# Patient Record
Sex: Male | Born: 2000 | Race: Black or African American | Hispanic: No | Marital: Single
Health system: Southern US, Community
[De-identification: ages and names within clinical notes are randomized; demographics above are authoritative.]

## PROBLEM LIST (undated history)

## (undated) DIAGNOSIS — F909 Attention-deficit hyperactivity disorder, unspecified type: Secondary | ICD-10-CM

---
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
## 2020-11-05 ENCOUNTER — Emergency Department: Payer: Self-pay

## 2020-11-05 ENCOUNTER — Inpatient Hospital Stay: Payer: Self-pay

## 2020-11-05 ENCOUNTER — Inpatient Hospital Stay
Admission: EM | Admit: 2020-11-05 | Discharge: 2020-11-12 | DRG: 917 | Disposition: E | Payer: Self-pay | Attending: Pulmonary Disease | Admitting: Pulmonary Disease

## 2020-11-05 ENCOUNTER — Encounter: Payer: Self-pay | Admitting: Emergency Medicine

## 2020-11-05 DIAGNOSIS — Z20822 Contact with and (suspected) exposure to covid-19: Secondary | ICD-10-CM | POA: Diagnosis present

## 2020-11-05 DIAGNOSIS — R001 Bradycardia, unspecified: Secondary | ICD-10-CM | POA: Diagnosis present

## 2020-11-05 DIAGNOSIS — E872 Acidosis, unspecified: Secondary | ICD-10-CM

## 2020-11-05 DIAGNOSIS — F1410 Cocaine abuse, uncomplicated: Secondary | ICD-10-CM

## 2020-11-05 DIAGNOSIS — T405X1A Poisoning by cocaine, accidental (unintentional), initial encounter: Principal | ICD-10-CM | POA: Diagnosis present

## 2020-11-05 DIAGNOSIS — K7200 Acute and subacute hepatic failure without coma: Secondary | ICD-10-CM

## 2020-11-05 DIAGNOSIS — J9602 Acute respiratory failure with hypercapnia: Secondary | ICD-10-CM | POA: Diagnosis present

## 2020-11-05 DIAGNOSIS — J9601 Acute respiratory failure with hypoxia: Secondary | ICD-10-CM

## 2020-11-05 DIAGNOSIS — Y92009 Unspecified place in unspecified non-institutional (private) residence as the place of occurrence of the external cause: Secondary | ICD-10-CM

## 2020-11-05 DIAGNOSIS — R570 Cardiogenic shock: Secondary | ICD-10-CM

## 2020-11-05 DIAGNOSIS — E874 Mixed disorder of acid-base balance: Secondary | ICD-10-CM | POA: Diagnosis present

## 2020-11-05 DIAGNOSIS — F14129 Cocaine abuse with intoxication, unspecified: Secondary | ICD-10-CM | POA: Diagnosis present

## 2020-11-05 DIAGNOSIS — Z66 Do not resuscitate: Secondary | ICD-10-CM | POA: Diagnosis not present

## 2020-11-05 DIAGNOSIS — T17908A Unspecified foreign body in respiratory tract, part unspecified causing other injury, initial encounter: Secondary | ICD-10-CM

## 2020-11-05 DIAGNOSIS — Z515 Encounter for palliative care: Secondary | ICD-10-CM

## 2020-11-05 DIAGNOSIS — I468 Cardiac arrest due to other underlying condition: Secondary | ICD-10-CM | POA: Diagnosis present

## 2020-11-05 DIAGNOSIS — E875 Hyperkalemia: Secondary | ICD-10-CM

## 2020-11-05 DIAGNOSIS — R739 Hyperglycemia, unspecified: Secondary | ICD-10-CM | POA: Diagnosis present

## 2020-11-05 DIAGNOSIS — G936 Cerebral edema: Secondary | ICD-10-CM | POA: Diagnosis present

## 2020-11-05 DIAGNOSIS — G931 Anoxic brain damage, not elsewhere classified: Secondary | ICD-10-CM

## 2020-11-05 DIAGNOSIS — I469 Cardiac arrest, cause unspecified: Secondary | ICD-10-CM | POA: Diagnosis present

## 2020-11-05 LAB — CBC WITH DIFFERENTIAL/PLATELET
Abs Immature Granulocytes: 1.74 10*3/uL — ABNORMAL HIGH (ref 0.00–0.07)
Basophils Absolute: 0.1 10*3/uL (ref 0.0–0.1)
Basophils Relative: 0 %
Eosinophils Absolute: 0.1 10*3/uL (ref 0.0–0.5)
Eosinophils Relative: 0 %
HCT: 49 % (ref 39.0–52.0)
Hemoglobin: 14.2 g/dL (ref 13.0–17.0)
Immature Granulocytes: 9 %
Lymphocytes Relative: 33 %
Lymphs Abs: 6.5 10*3/uL — ABNORMAL HIGH (ref 0.7–4.0)
MCH: 24.5 pg — ABNORMAL LOW (ref 26.0–34.0)
MCHC: 29 g/dL — ABNORMAL LOW (ref 30.0–36.0)
MCV: 84.5 fL (ref 80.0–100.0)
Monocytes Absolute: 1.7 10*3/uL — ABNORMAL HIGH (ref 0.1–1.0)
Monocytes Relative: 8 %
Neutro Abs: 10 10*3/uL — ABNORMAL HIGH (ref 1.7–7.7)
Neutrophils Relative %: 50 %
Platelets: 209 10*3/uL (ref 150–400)
RBC: 5.8 MIL/uL (ref 4.22–5.81)
RDW: 17.3 % — ABNORMAL HIGH (ref 11.5–15.5)
Smear Review: NORMAL
WBC: 20 10*3/uL — ABNORMAL HIGH (ref 4.0–10.5)
nRBC: 3.1 % — ABNORMAL HIGH (ref 0.0–0.2)

## 2020-11-05 LAB — PROCALCITONIN: Procalcitonin: 1.44 ng/mL

## 2020-11-05 LAB — LACTIC ACID, PLASMA
Lactic Acid, Venous: >11 mmol/L (ref 0.5–1.9)
Lactic Acid, Venous: >11 mmol/L (ref 0.5–1.9)
Lactic Acid, Venous: >11 mmol/L (ref 0.5–1.9)

## 2020-11-05 LAB — BLOOD GAS, ARTERIAL
Acid-base deficit: 23.4 mmol/L — ABNORMAL HIGH (ref 0.0–2.0)
Bicarbonate: 11 mmol/L — ABNORMAL LOW (ref 20.0–28.0)
FIO2: 1
MECHVT: 490 mL
O2 Saturation: 74.6 %
PEEP: 10 cmH2O
Patient temperature: 37
RATE: 24 resp/min
pCO2 arterial: 66 mmHg (ref 32.0–48.0)
pH, Arterial: <6.9 — CL (ref 7.350–7.450)
pO2, Arterial: 73 mmHg — ABNORMAL LOW (ref 83.0–108.0)

## 2020-11-05 LAB — COMPREHENSIVE METABOLIC PANEL
ALT: 5653 U/L — ABNORMAL HIGH (ref 0–44)
AST: 3554 U/L — ABNORMAL HIGH (ref 15–41)
Albumin: 2.8 g/dL — ABNORMAL LOW (ref 3.5–5.0)
Alkaline Phosphatase: 113 U/L (ref 38–126)
Anion gap: 25 — ABNORMAL HIGH (ref 5–15)
BUN: 19 mg/dL (ref 6–20)
CO2: 15 mmol/L — ABNORMAL LOW (ref 22–32)
Calcium: 9 mg/dL (ref 8.9–10.3)
Chloride: 99 mmol/L (ref 98–111)
Creatinine, Ser: 3 mg/dL — ABNORMAL HIGH (ref 0.61–1.24)
GFR, Estimated: 30 mL/min — ABNORMAL LOW (ref >60)
Glucose, Bld: 441 mg/dL — ABNORMAL HIGH (ref 70–99)
Potassium: 7.5 mmol/L (ref 3.5–5.1)
Sodium: 139 mmol/L (ref 135–145)
Total Bilirubin: 0.8 mg/dL (ref 0.3–1.2)
Total Protein: 5.3 g/dL — ABNORMAL LOW (ref 6.5–8.1)

## 2020-11-05 LAB — URINE DRUG SCREEN, QUALITATIVE (ARMC ONLY)
Amphetamines, Ur Screen: NOT DETECTED
Barbiturates, Ur Screen: NOT DETECTED
Benzodiazepine, Ur Scrn: NOT DETECTED
Cannabinoid 50 Ng, Ur ~~LOC~~: POSITIVE — AB
Cocaine Metabolite,Ur ~~LOC~~: POSITIVE — AB
MDMA (Ecstasy)Ur Screen: NOT DETECTED
Methadone Scn, Ur: NOT DETECTED
Opiate, Ur Screen: NOT DETECTED
Phencyclidine (PCP) Ur S: NOT DETECTED
Tricyclic, Ur Screen: NOT DETECTED

## 2020-11-05 LAB — RESP PANEL BY RT-PCR (FLU A&B, COVID) ARPGX2
Influenza A by PCR: NEGATIVE
Influenza B by PCR: NEGATIVE
SARS Coronavirus 2 by RT PCR: NEGATIVE

## 2020-11-05 LAB — MAGNESIUM: Magnesium: 3.6 mg/dL — ABNORMAL HIGH (ref 1.7–2.4)

## 2020-11-05 LAB — APTT: aPTT: 52 seconds — ABNORMAL HIGH (ref 24–36)

## 2020-11-05 LAB — SALICYLATE LEVEL: Salicylate Lvl: 7.6 mg/dL (ref 7.0–30.0)

## 2020-11-05 LAB — TYPE AND SCREEN
ABO/RH(D): B POS
Antibody Screen: NEGATIVE

## 2020-11-05 LAB — PROTIME-INR
INR: 1.6 — ABNORMAL HIGH (ref 0.8–1.2)
Prothrombin Time: 18.9 seconds — ABNORMAL HIGH (ref 11.4–15.2)

## 2020-11-05 LAB — MRSA NEXT GEN BY PCR, NASAL: MRSA by PCR Next Gen: NOT DETECTED

## 2020-11-05 LAB — TROPONIN I (HIGH SENSITIVITY)
Troponin I (High Sensitivity): 187 ng/L (ref <18)
Troponin I (High Sensitivity): 1042 ng/L (ref <18)

## 2020-11-05 LAB — BRAIN NATRIURETIC PEPTIDE: B Natriuretic Peptide: 87.1 pg/mL (ref 0.0–100.0)

## 2020-11-05 LAB — ETHANOL: Alcohol, Ethyl (B): 31 mg/dL — ABNORMAL HIGH (ref <10)

## 2020-11-05 MED ORDER — GLYCOPYRROLATE 0.2 MG/ML IJ SOLN: 0.2000 mg | INTRAMUSCULAR | Status: DC | PRN

## 2020-11-05 MED ORDER — ENOXAPARIN SODIUM 40 MG/0.4ML IJ SOSY: 40.0000 mg | PREFILLED_SYRINGE | INTRAMUSCULAR | Status: DC

## 2020-11-05 MED ORDER — POLYETHYLENE GLYCOL 3350 17 G PO PACK: 17.0000 g | PACK | Freq: Every day | ORAL | Status: DC | PRN

## 2020-11-05 MED ORDER — ATROPINE SULFATE 1 MG/ML IJ SOLN
INTRAMUSCULAR | Status: AC | PRN
  Administered 2020-11-05: 1 mg via INTRAVENOUS

## 2020-11-05 MED ORDER — POLYETHYLENE GLYCOL 3350 17 G PO PACK: 17.0000 g | PACK | Freq: Every day | ORAL | Status: DC

## 2020-11-05 MED ORDER — ACETAMINOPHEN 325 MG PO TABS: 650.0000 mg | ORAL_TABLET | Freq: Four times a day (QID) | ORAL | Status: DC | PRN

## 2020-11-05 MED ORDER — MORPHINE SULFATE (PF) 2 MG/ML IV SOLN
2.0000 mg | INTRAVENOUS | Status: DC | PRN
  Filled 2020-11-05: qty 1

## 2020-11-05 MED ORDER — CHLORHEXIDINE GLUCONATE CLOTH 2 % EX PADS: 6.0000 | MEDICATED_PAD | Freq: Every day | CUTANEOUS | Status: DC

## 2020-11-05 MED ORDER — ACETAMINOPHEN 650 MG RE SUPP: 650.0000 mg | Freq: Four times a day (QID) | RECTAL | Status: DC | PRN

## 2020-11-05 MED ORDER — DIPHENHYDRAMINE HCL 50 MG/ML IJ SOLN: 25.0000 mg | INTRAMUSCULAR | Status: DC | PRN

## 2020-11-05 MED ORDER — STERILE WATER FOR INJECTION IV SOLN
INTRAVENOUS | Status: DC
  Filled 2020-11-05 (×4): qty 1000

## 2020-11-05 MED ORDER — PANTOPRAZOLE SODIUM 40 MG IV SOLR: 40.0000 mg | Freq: Every day | INTRAVENOUS | Status: DC

## 2020-11-05 MED ORDER — CALCIUM CHLORIDE 10 % IV SOLN
INTRAVENOUS | Status: AC
  Administered 2020-11-05: 1 g
  Filled 2020-11-05: qty 10

## 2020-11-05 MED ORDER — EPINEPHRINE HCL 5 MG/250ML IV SOLN IN NS
0.5000 ug/min | INTRAVENOUS | Status: DC
Start: 2020-11-05 — End: 2020-11-05
  Administered 2020-11-05: 20 ug/min via INTRAVENOUS
  Administered 2020-11-05 (×2): 30 ug/min via INTRAVENOUS
  Filled 2020-11-05 (×3): qty 250

## 2020-11-05 MED ORDER — MIDAZOLAM HCL 2 MG/2ML IJ SOLN
2.0000 mg | INTRAMUSCULAR | Status: DC | PRN
Start: 2020-11-05 — End: 2020-11-05

## 2020-11-05 MED ORDER — FENTANYL CITRATE PF 50 MCG/ML IJ SOSY: 50.0000 ug | PREFILLED_SYRINGE | INTRAMUSCULAR | Status: DC | PRN

## 2020-11-05 MED ORDER — POLYVINYL ALCOHOL 1.4 % OP SOLN
1.0000 [drp] | Freq: Four times a day (QID) | OPHTHALMIC | Status: DC | PRN
  Filled 2020-11-05: qty 15

## 2020-11-05 MED ORDER — NOREPINEPHRINE 4 MG/250ML-% IV SOLN
INTRAVENOUS | Status: AC | PRN
  Administered 2020-11-05: 30 ug/min via INTRAVENOUS
  Administered 2020-11-05: 5 ug/min via INTRAVENOUS

## 2020-11-05 MED ORDER — SODIUM BICARBONATE 8.4 % IV SOLN
INTRAVENOUS | Status: AC | PRN
  Administered 2020-11-05 (×2): 50 meq via INTRAVENOUS

## 2020-11-05 MED ORDER — NOREPINEPHRINE 16 MG/250ML-% IV SOLN
0.0000 ug/min | INTRAVENOUS | Status: DC
  Administered 2020-11-05: 40 ug/min via INTRAVENOUS
  Filled 2020-11-05: qty 250

## 2020-11-05 MED ORDER — DOCUSATE SODIUM 50 MG/5ML PO LIQD
100.0000 mg | Freq: Two times a day (BID) | ORAL | Status: DC
  Filled 2020-11-05: qty 10

## 2020-11-05 MED ORDER — GLYCOPYRROLATE 1 MG PO TABS
1.0000 mg | ORAL_TABLET | ORAL | Status: DC | PRN
  Filled 2020-11-05: qty 1

## 2020-11-05 MED ORDER — DEXTROSE 5 % IV SOLN: INTRAVENOUS | Status: DC

## 2020-11-05 MED ORDER — CALCIUM CHLORIDE 10 % IV SOLN
INTRAVENOUS | Status: AC | PRN
  Administered 2020-11-05 (×2): 1 g via INTRAVENOUS

## 2020-11-05 MED ORDER — NOREPINEPHRINE 4 MG/250ML-% IV SOLN
2.0000 ug/min | INTRAVENOUS | Status: DC
  Administered 2020-11-05: 40 ug/min via INTRAVENOUS
  Filled 2020-11-05 (×2): qty 250

## 2020-11-05 MED ORDER — SODIUM CHLORIDE 0.9 % IV BOLUS: 500.0000 mL | Freq: Once | INTRAVENOUS | Status: DC

## 2020-11-05 MED ORDER — VASOPRESSIN 20 UNITS/100 ML INFUSION FOR SHOCK
0.0000 [IU]/min | INTRAVENOUS | Status: DC
  Administered 2020-11-05: 0.03 [IU]/min via INTRAVENOUS
  Filled 2020-11-05 (×2): qty 100

## 2020-11-05 MED ORDER — CALCIUM CHLORIDE 10 % IV SOLN
INTRAVENOUS | Status: AC
  Filled 2020-11-05: qty 10

## 2020-11-05 MED ORDER — MIDAZOLAM HCL 2 MG/2ML IJ SOLN: 2.0000 mg | INTRAMUSCULAR | Status: DC | PRN

## 2020-11-05 MED ORDER — ASPIRIN 81 MG PO CHEW: 324.0000 mg | CHEWABLE_TABLET | ORAL | Status: DC

## 2020-11-05 MED ORDER — ACETAMINOPHEN 325 MG PO TABS: 650.0000 mg | ORAL_TABLET | ORAL | Status: DC | PRN

## 2020-11-05 MED ORDER — SODIUM BICARBONATE 8.4 % IV SOLN
INTRAVENOUS | Status: AC
  Administered 2020-11-05: 50 meq via INTRAVENOUS
  Filled 2020-11-05: qty 50

## 2020-11-05 MED ORDER — SODIUM CHLORIDE 0.9 % IV SOLN: 250.0000 mL | INTRAVENOUS | Status: DC

## 2020-11-05 MED ORDER — EPINEPHRINE 1 MG/10ML IJ SOSY
PREFILLED_SYRINGE | INTRAMUSCULAR | Status: AC | PRN
  Administered 2020-11-05 (×4): 1 mg via INTRAVENOUS

## 2020-11-05 MED ORDER — ASPIRIN 300 MG RE SUPP: 300.0000 mg | RECTAL | Status: DC

## 2020-11-05 MED ORDER — DOCUSATE SODIUM 100 MG PO CAPS: 100.0000 mg | ORAL_CAPSULE | Freq: Two times a day (BID) | ORAL | Status: DC | PRN

## 2020-11-05 MED ORDER — INSULIN ASPART 100 UNIT/ML IJ SOLN: 10.0000 [IU] | Freq: Once | INTRAMUSCULAR | Status: AC

## 2020-11-05 MED ORDER — INSULIN ASPART 100 UNIT/ML IJ SOLN
INTRAMUSCULAR | Status: AC
  Administered 2020-11-05: 10 [IU] via INTRAVENOUS
  Filled 2020-11-05: qty 1

## 2020-11-05 MED ORDER — ALBUTEROL SULFATE (2.5 MG/3ML) 0.083% IN NEBU: 2.5000 mg | INHALATION_SOLUTION | RESPIRATORY_TRACT | Status: DC | PRN

## 2020-11-05 MED ORDER — SODIUM CHLORIDE 0.9 % IV SOLN
INTRAVENOUS | Status: AC | PRN
  Administered 2020-11-05: 1000 mL via INTRAVENOUS

## 2020-11-06 LAB — CORTISOL: Cortisol, Plasma: 9.1 ug/dL

## 2020-11-06 LAB — HIV ANTIBODY (ROUTINE TESTING W REFLEX): HIV Screen 4th Generation wRfx: NONREACTIVE

## 2020-11-08 LAB — THYROID PANEL WITH TSH
Free Thyroxine Index: 1.8 (ref 1.2–4.9)
T3 Uptake Ratio: 37 % (ref 24–39)
T4, Total: 4.9 ug/dL (ref 4.5–12.0)
TSH: 7.31 u[IU]/mL — ABNORMAL HIGH (ref 0.450–4.500)

## 2020-11-12 NOTE — ED Notes (Signed)
Pt starting to become bradycardic again, EDP called to bedside

## 2020-11-12 NOTE — ED Notes (Signed)
Family at bedside. 

## 2020-11-12 NOTE — H&P (Addendum)
NAME:  Shawn Henderson, MRN:  [id number], DOB:  [birth year], LOS: 0 ADMISSION DATE:  28-Nov-2020, CONSULTATION DATE: 11/28/2020 REFERRING MD: Shawn Hacking, MD, CHIEF COMPLAINT: Cardiac arrest  History of Present Illness:  Shawn Henderson is a 20 year old male with no significant past medical history who presented as an out of hospital arrest with an unknown downtime.  She cannot provide history and is obtained from discussion with Dr. Sidney Henderson as well as family available at bedside.  Per the patient's aunt who was at the bedside patient had been in his normal state of health this morning.  Her breakfast he went to take a nap.  Unsure how long he was "napping" patient's mother went to wake up the patient and noticed that he was "breathing funny" she could not wake him up.  EMS was activated and he was noted to be in asystole.  He was coded for approximately 40 minutes in the field receiving 3 rounds of epinephrine and a King airway placed.  He arrived to the emergency room [facility] actively being coded.  He was intubated by the emergency room physician.  He received multiple rounds of epinephrine as he was noted to be in asystole.  Patient then developed a bradycardic wide-complex rhythm.  EKG was consistent with global ischemia.  Currently on norepinephrine and epinephrine drips.  Laboratory data revealed profound metabolic acidosis with a pH of less than 6.9, PCO2 of over 120 and potassium of 7.5 and a chest x-ray consistent with trach edema versus aspiration versus early ARDS.  Additionally mild laboratory data show that urine drug screen was positive for cocaine and cannabinoids.  Pertinent  Medical History  No significant past medical history noted  Significant Hospital Events: Including procedures, antibiotic start and stop dates in addition to other pertinent events   9/24 out of hospital arrest, unknown downtime, prolonged resuscitation time of over 40 minutes  Interim History / Subjective:   Patient cannot provide subjective impressions due to acuity of condition  Objective   Blood pressure (!) 94/29, pulse (!) 45, resp. rate (!) 24, SpO2 (!) 86 %.    Vent Mode: AC FiO2 (%):  [100 %] 100 % Set Rate:  [16 bmp-24 bmp] 24 bmp Vt Set:  [480 mL-500 mL] 480 mL PEEP:  [5 cmH20] 5 cmH20  No intake or output data in the 24 hours ending Nov 28, 2020 1648 There were no vitals filed for this visit.  Examination: General: Critically ill appearing young man, intubated, ventilated unresponsive HEENT: Pupils midpoint and fixed, sclera injected, orotracheally intubated Lungs: Coarse breath sounds, no wheezes noted, no rhonchi Cardiovascular: Bradycardic, cannot assess further due to [facility] device and fibrillator pads on the chest Abdomen: Nondistended, soft, normoactive bowel sounds Extremities: No cyanosis or edema noted, distal pulses thready Neuro: Unresponsive, obtunded, pupils midpoint and fixed GU: Foley in place, no urine noted  Resolved Hospital Problem list     Assessment & Plan:  Acute respiratory failure with hypoxia and hypercapnia due to cardiac arrest Possible aspiration Patient will need ongoing ventilator support Ventilator adjustments made PEEP increased to 10 Will likely need pressure control ventilation Obtain arterial blood gas on adjusted ventilator settings VAP protocol Daily wake-up assessments as critical illness allows As needed bronchodilators Initiate antibiotics if spikes temperature  Cardiac arrest in the setting of cocaine ingestion Global ischemia on EKG Cardiogenic shock Continue supportive care Continue pressors Downtime was unknown Prognosis is exceedingly guarded Serial EKGs Trend troponins  Severe metabolic/lactic acidosis Hyperkalemia secondary to the  above Due to hypoperfusion from the arrest/cardiogenic shock Volume resuscitate Bicarbonate infusion Adjust ventilator rate to normalize PaCO2 Pressors as needed to maintain  perfusion Trend potassium and treat as necessary  Shock liver with transaminitis  Supportive care Check serial hepatic function  Acute encephalopathy likely severe anoxic injury Baseline CT head Monitor neurologic function EEG/MRI at 48 hours  Cocaine abuse with intoxication Suspect cardiac arrhythmia leading to cardiac arrest from cocaine intoxication Myocardial injury also likely  Best Practice (right click and "Reselect all SmartList Selections" daily)   Diet/type: NPO DVT prophylaxis: LMWH GI prophylaxis: PPI Lines: N/A Foley:  Yes, and it is still needed Code Status:  full code Last date of multidisciplinary goals of care discussion [09-Nov-2020]  Discussed with available family at bedside this included aunt.  Advised that the patient's echinosis is exceedingly guarded given the metabolic derangements and the downtime.  It is likely that he will have significant anoxic brain injury.  Overall prognosis is exceedingly guarded.  Labs   CBC: Recent Labs  Lab 11/09/20 1441  WBC 20.0*  NEUTROABS 10.0*  HGB 14.2  HCT 49.0  MCV 84.5  PLT 209    Basic Metabolic Panel: Recent Labs  Lab 2020/11/09 1441  NA 139  K 7.5*  CL 99  CO2 15*  GLUCOSE 441*  BUN 19  CREATININE 3.00*  CALCIUM 9.0   GFR: CrCl cannot be calculated (Unknown ideal weight.). Recent Labs  Lab Nov 09, 2020 1441 Nov 09, 2020 1615  WBC 20.0*  --   LATICACIDVEN >11.0* >11.0*    Liver Function Tests: Recent Labs  Lab 2020-11-09 1441  AST 3,554*  ALT PENDING  ALKPHOS 113  BILITOT 0.8  PROT 5.3*  ALBUMIN 2.8*   No results for input(s): LIPASE, AMYLASE in the last 168 hours. No results for input(s): AMMONIA in the last 168 hours.  ABG    Component Value Date/Time   HCO3 PENDING Nov 09, 2020 1512   O2SAT PENDING Nov 09, 2020 1512     Coagulation Profile: Recent Labs  Lab Nov 09, 2020 1441  INR 1.6*    Cardiac Enzymes: No results for input(s): CKTOTAL, CKMB, CKMBINDEX, TROPONINI in the last 168  hours.  HbA1C: No results found for: HGBA1C  CBG: No results for input(s): GLUCAP in the last 168 hours.  Chest x-ray independently reviewed:  Review of Systems:   Patient unable to provide review of systems due to obtundation and mechanically ventilated status.  Past Medical History:  No past history on file noted  Surgical History:  Pertinent history on file noted  Social History:  Known that he uses "substances" per family unsure what Patient does smoke daily an unknown amount  Family History:  His family history is not on file.   Allergies No Known Allergies   Home Medications  Prior to Admission medications   Not on File  No prior known medications  Critical care time: 50 minutes    The patient is critically ill with multiple organ systems failure and requires high complexity decision making for assessment and support, frequent evaluation and titration of therapies, application of advanced monitoring technologies and extensive interpretation of multiple databases. Critical Care Time devoted to patient care services described in this note is 50 minutes.   Gailen Shelter, MD Advanced Bronchoscopy [facility]    *This note was dictated using voice recognition software/Dragon.  Despite best efforts to proofread, errors can occur which can change the meaning.  Any change was purely unintentional.

## 2020-11-12 NOTE — ED Notes (Signed)
.  1mg epi given

## 2020-11-12 NOTE — Code Documentation (Signed)
Pt bradycardic with a faint pulse.

## 2020-11-12 NOTE — Progress Notes (Signed)
Patient has remained unresponsive postcode.  Because of the time down CT scan of the head was obtained and this is consistent with severe anoxic injury and cerebral edema loss of gray-white matter differentiation loss of sulci.  Prognosis is grave.  Will discuss with family.   Representative image from CT head performed.       Gailen Shelter, MD Advanced Bronchoscopy P[facility]    *This note was dictated using voice recognition software/Dragon.  Despite best efforts to proofread, errors can occur which can change the meaning.  Any change was purely unintentional.

## 2020-11-12 NOTE — ED Provider Notes (Addendum)
[facility]  ____________________________________________   Event Date/Time   First MD Initiated Contact with Patient 12-Nov-2020 1459     (approximate)  I have reviewed the triage vital signs and the nursing notes.   HISTORY  Chief Complaint Cardiac Arrest    HPI Shawn Henderson is a 20 y.o. male with no significant past medical history presents in cardiac arrest.  Unable to obtain history from the patient given the acuity of his condition.  Per family, the patient was in his normal state of health this morning.  He took a nap and then they were not able to wake him.  They do suspect that he took a substance.  Does have a history of using Percocets.  Found to be in asystole with EMS.  Unknown downtime.  He was coded for 40 minutes in the field, 3 rounds of epinephrine and King airway placed.         History reviewed. No pertinent past medical history.  There are no problems to display for this patient.   History reviewed. No pertinent surgical history.  Prior to Admission medications   Not on File    Allergies Patient has no known allergies.  No family history on file.  Social History    Review of Systems   Review of Systems  Unable to perform ROS: Intubated   Physical Exam Updated Vital Signs BP (!) 95/32   Pulse 90   Resp (!) 24   SpO2 (!) 87%   Physical Exam Vitals and nursing note reviewed.  Constitutional:      Comments: Appears acutely ill  HENT:     Head: Normocephalic and atraumatic.     Nose: Nose normal.     Mouth/Throat:     Comments: Emesis coming from the [facility] airway Eyes:     Comments: Pupils are fixed and dilated  Cardiovascular:     Comments: No pulse Pulmonary:     Comments: King airway in place, emesis from the tube, significant secretions, mechanical breath sounds bilaterally Abdominal:     General: There is no distension.     Palpations: Abdomen is soft. There is no mass.  Genitourinary:    Penis:  Normal.   Musculoskeletal:        General: No swelling, deformity or signs of injury.     Cervical back: Neck supple.     Right lower leg: No edema.     Left lower leg: No edema.  Skin:    Capillary Refill: Capillary refill takes more than 3 seconds.     Comments: Skin is cool  Psychiatric:     Comments: Unable to assess     LABS (all labs ordered are listed, but only abnormal results are displayed)  Labs Reviewed  CBC WITH DIFFERENTIAL/PLATELET - Abnormal; Notable for the following components:      Result Value   WBC 20.0 (*)    MCH 24.5 (*)    MCHC 29.0 (*)    RDW 17.3 (*)    nRBC 3.1 (*)    Neutro Abs 10.0 (*)    Lymphs Abs 6.5 (*)    Monocytes Absolute 1.7 (*)    Abs Immature Granulocytes 1.74 (*)    All other components within normal limits  COMPREHENSIVE METABOLIC PANEL - Abnormal; Notable for the following components:   Potassium 7.5 (*)    CO2 15 (*)    Glucose, Bld 441 (*)    Creatinine, Ser 3.00 (*)  Total Protein 5.3 (*)    Albumin 2.8 (*)    AST 3,554 (*)    GFR, Estimated 30 (*)    Anion gap 25 (*)    All other components within normal limits  PROTIME-INR - Abnormal; Notable for the following components:   Prothrombin Time 18.9 (*)    INR 1.6 (*)    All other components within normal limits  APTT - Abnormal; Notable for the following components:   aPTT 52 (*)    All other components within normal limits  LACTIC ACID, PLASMA - Abnormal; Notable for the following components:   Lactic Acid, Venous >11.0 (*)    All other components within normal limits  URINE DRUG SCREEN, QUALITATIVE ([facility] ONLY) - Abnormal; Notable for the following components:   Cocaine Metabolite,Ur [place] POSITIVE (*)    Cannabinoid 50 Ng, Ur [place] POSITIVE (*)    All other components within normal limits  BLOOD GAS, VENOUS - Abnormal; Notable for the following components:   pH, Ven <6.900 (*)    pCO2, Ven >120.0 (*)    pO2, Ven 48.0 (*)    All other components within normal limits   TROPONIN I (HIGH SENSITIVITY) - Abnormal; Notable for the following components:   Troponin I (High Sensitivity) 187 (*)    All other components within normal limits  RESP PANEL BY RT-PCR (FLU A&B, COVID) ARPGX2  LACTIC ACID, PLASMA  ETHANOL  SALICYLATE LEVEL  TYPE AND SCREEN  TROPONIN I (HIGH SENSITIVITY)   ____________________________________________  EKG  Right axis deviation, irregular, ST elevation in aVR and V1 reciprocal depression in 1 and aVL, diffuse deep T wave inversions ____________________________________________  RADIOLOGY I, Randol Kern, personally viewed and evaluated these images (plain radiographs) as part of my medical decision making, as well as reviewing the written report by the radiologist.  ED MD interpretation: I reviewed the chest x-ray which showed the tube is high, right lung field with patchy opacities throughout    ____________________________________________   PROCEDURES  Procedure(s) performed (including Critical Care):  Procedure Name: Intubation Date/Time: 11-17-20 4:23 PM Performed by: Georga Hacking, MD Pre-anesthesia Checklist: Patient identified, Patient being monitored, Emergency Drugs available and Suction available Oxygen Delivery Method: Ambu bag Preoxygenation: Pre-oxygenation with 100% oxygen Ventilation: Mask ventilation without difficulty Laryngoscope Size: Glidescope Grade View: Grade I Tube size: 8.0 mm Number of attempts: 1 Placement Confirmation: ETT inserted through vocal cords under direct vision, CO2 detector and Breath sounds checked- equal and bilateral Tube secured with: ETT holder    .Critical Care Performed by: Georga Hacking, MD Authorized by: Georga Hacking, MD   Critical care provider statement:    Critical care time (minutes):  80   Critical care was necessary to treat or prevent imminent or life-threatening deterioration of the following conditions:  Cardiac failure   Critical care  was time spent personally by me on the following activities:  Discussions with consultants, evaluation of patient's response to treatment, examination of patient, ordering and performing treatments and interventions, ordering and review of laboratory studies, ordering and review of radiographic studies, pulse oximetry, re-evaluation of patient's condition, obtaining history from patient or surrogate and review of old charts   Care discussed with: admitting provider     ____________________________________________   INITIAL IMPRESSION / ASSESSMENT AND PLAN / ED COURSE   20 year old male presents in cardiac arrest.  It was unwitnessed arrest, unknown downtime.  He was coded for about 40 minutes in the field asystole with 3 rounds  of epinephrine.  On arrival to the ED he is actively being coded.  He was intubated with an ET tube using glide scope successfully.  IV access was obtained.  He received multiple rounds of epinephrine, was initially asystole.  However he then developed a very bradycardic wide rhythm.  He was given calcium and bicarb, started on Levophed drip and heart rate seemed to improve.  He was able to generate a blood pressure and bedside ultrasound confirmed good cardiac activity.  EKG showed wide-complex rhythm with ST elevation in aVR and diffuse ST depressions with large T wave inversions.  I suspect that this is related to global ischemia but did discuss with the cath attending who agrees, no indication for emergent cath.  He then bradycardia down again and was started on epinephrine drip.  Labs slowly coming back notable for significant metabolic and respiratory acidosis.  His respiratory rate was increased on the ventilator.  He also has hyperkalemia with a potassium of 7.5 which may be driving his abnormal rhythm and bradycardia.  He is hyperglycemic so was given 10 of insulin without dextrose, started on bicarb drip and given calcium additionally.  Patient sats 100% FiO2 in the high  80s.  Chest x-ray shows early ARDS likely.  I had a long discussion with the family, his mother and aunt about the overall poor prognosis, occluding likelihood that he will not have any meaningful neurologic outcome.  Gust with Dr. Jayme Cloud with ICU who accepts the patient.  In discussion with her we will start him on vasopressin.  Will be admitted to the ICU.  Stable enough we will attempt to get a CT head for further prognostication.      ____________________________________________   FINAL CLINICAL IMPRESSION(S) / ED DIAGNOSES  Final diagnoses:  Cardiac arrest (HCC)  Hyperkalemia  Metabolic acidosis     ED Discharge Orders     None        Note:  This document was prepared using Dragon voice recognition software and may include unintentional dictation errors.    Georga Hacking, MD 11-08-2020 1630    Georga Hacking, MD 11-08-20 [phone]

## 2020-11-12 NOTE — Code Documentation (Signed)
CPR paused for pulse check, pt in asystole. PEA noted on bedside ultrasound. CPR restarted

## 2020-11-12 NOTE — ED Notes (Signed)
Family at bedside, MD to update

## 2020-11-12 NOTE — ED Notes (Signed)
1 amp sodium bi carb given in IO

## 2020-11-12 NOTE — Progress Notes (Signed)
   Nov 25, 2020 1900  Clinical Encounter Type  Visited With Family  Visit Type Initial;Social support;Spiritual support;Critical Care;Patient actively dying;Psychological support  Referral From Nurse  Consult/Referral To Chaplain   Chaplain responded to a page from medical staff, that a family could use support. PT is actively dying. Chaplain found a large family presence in the waiting area. Hospitality, compassionate presence, and reflective listening were all elements of this visit. Chaplain will try to follow up, since more family should be arriving.

## 2020-11-12 NOTE — Code Documentation (Signed)
Per MD hold compressions and give 1 mg of Epi

## 2020-11-12 NOTE — ED Triage Notes (Signed)
Pt ED via [facility], pt arrives to the ED with CPR in progress with LUCAS, ventilations being assisted with BVM.   Per EMS, pt was found down by family, crack pipe was found beside patient. EMS administered 1 narcan. Pt was given 3 rounds of epi by EMS but has remained asystolic. Per EMS they had given 45 minutes of CPR on arrival to the ED. Pt has been vomiting with EMS.   EDP at bedside.

## 2020-11-12 NOTE — Code Documentation (Signed)
CPR resumed 

## 2020-11-12 NOTE — Code Documentation (Signed)
CPR paused for pulse check, no pulses present, asystole on monitor

## 2020-11-12 NOTE — ED Notes (Signed)
ICU MD at bedside

## 2020-11-12 NOTE — Code Documentation (Signed)
No cardiac activity noted on bedside ultrasound

## 2020-11-12 NOTE — ED Notes (Signed)
ETT readjusted by MDS Padochowski and McHugh to 25@lip 

## 2020-11-12 NOTE — Death Summary Note (Signed)
DEATH SUMMARY   Patient Details  Name: Shawn Henderson MRN: [id number] DOB: [birth year]  Admission/Discharge Information   Admit Date:  November 14, 2020  Date of Death: 11/14/2020  Time of Death: 22:48  Length of Stay: 0  Referring Physician: Pcp, No   Reason(s) for Hospitalization  Cardiac Arrest in the the setting of cocaine ingestion  Diagnoses  Preliminary cause of death: Cardiac arrest in the setting of cocaine ingestion and subsequent severe anoxic brain injury Secondary Diagnoses (including complications and co-morbidities):  Principal Problem:   Cardiac arrest [facility]) Active Problems:   Acute respiratory failure with hypoxia and hypercapnia (HCC)   Cocaine abuse (HCC)   Cardiogenic shock (HCC)   Aspiration into respiratory tract   Metabolic acidosis   Lactic acidosis   Hyperkalemia   Shock liver   Anoxic brain injury [facility])   Brief Hospital Course (including significant findings, care, treatment, and services provided and events leading to death)  Shawn Henderson is a 20 y.o. year old male who presented to [facility] ED via EMS on 11-14-2020 as an out of hospital cardiac arrest with an unknown downtime.  Per the patient's aunt he had been in his normal state of health on the morning of 11-14-20: Ate breakfast and went to take a nap.  It is unknown how long he was " napping" but when the patient's mother went to wake him up she noticed he was " breathing funny" and she could not wake him up.  Upon EMSs arrival they found the patient in asystole.  He was coded per ACLS protocol in the field for approximately 40 minutes receiving 3 rounds of epinephrine and a King airway.  He arrived at [facility] ED as CPR in progress and was intubated emergently by the EDP.  He then received additional multiple rounds of epinephrine, again noted to be in asystole initially transitioning into a bradycardic wide-complex rhythm.  EKG was consistent with global ischemia.  ROSC obtained with norepinephrine and  epinephrine drips transfusing.  Laboratory data revealed profound metabolic acidosis/severe lactic acidosis and hyperkalemia with a pH less than 6.9, PCO2 over 120, potassium of 7.5 and a lactic greater than 11.  Urine drug screen was positive for cocaine and cannabinoids.  Chest x-ray consistent with tracheal edema versus aspiration versus early ARDS.  Patient remained unresponsive post ROSC, CT head without contrast obtained was consistent with severe anoxic injury with cerebral edema, loss of gray matter differentiation & loss of sulci.  Care was escalated to include a third vasopressor: Norepinephrine , epinephrine & vasopressin all at maximum doses.  Patient's blood pressure remains refractory to these interventions.  Oxygenation status is also poor with SPO2 in the 60s to 80s on 100% FiO2. Due to the patient's grave prognosis, family was consulted with chaplain assistance and the patient was transitioned to DNR CODE STATUS.  Once all family that was able to arrive bedside was present, patient was transition to comfort measures and passed with family bedside.  Pertinent Labs and Studies  Significant Diagnostic Studies CT HEAD WO CONTRAST ( )  Result Date: 11/14/20 CLINICAL DATA:  Altered mental status EXAM: CT HEAD WITHOUT CONTRAST TECHNIQUE: Contiguous axial images were obtained from the base of the skull through the vertex without intravenous contrast. COMPARISON:  None. FINDINGS: Brain: There is diffuse effacement of the sulci, best appreciated at the vertex, as well as effacement of the ambient and interpeduncular cisterns most in keeping with diffuse, mild cerebral edema. Suprasellar cistern is preserved. Gray-white matter differentiation is preserved.  There is no midline shift. No acute intracranial hemorrhage or infarct. No abnormal intra or extra-axial mass lesion. Ventricular size is normal. Cerebellum is unremarkable. Vascular: No asymmetric hyperdense vasculature at the skull base. Skull:  Intact Sinuses/Orbits: The orbits are unremarkable. There is mucosal thickening within the ethmoid air cells and sphenoid sinuses as well as air-fluid levels within the sphenoid sinuses and maxillary sinuses in keeping with changes of acute sinusitis in the appropriate clinical setting. Other: Mastoid air cells and middle ear cavities are clear. IMPRESSION: Diffuse mild cerebral edema with effacement of the basal cisterns and cortical sulci. No evidence of transtentorial or subfalcine herniation. No evidence of acute intracranial hemorrhage or infarct. Moderate paranasal sinus disease in keeping with acute sinusitis in the appropriate clinical setting. Electronically Signed   By: Helyn Numbers M.D.   On: 2020/12/02 19:12   DG Chest Portable 1 View  Result Date: 02-Dec-2020 CLINICAL DATA:  Respiratory failure. EXAM: PORTABLE CHEST 1 VIEW COMPARISON:  None. FINDINGS: The heart size and mediastinal contours are within normal limits. Endotracheal tube is in grossly good position. Left lung is clear. No pneumothorax is noted. Diffuse opacification of right lung is noted concerning for pneumonia or possibly asymmetric edema. The visualized skeletal structures are unremarkable. IMPRESSION: Endotracheal tube in grossly good position. Diffuse right lung opacity is noted concerning for pneumonia or possibly asymmetric edema. Electronically Signed   By: Lupita Raider M.D.   On: 02-Dec-2020 15:53    Microbiology Recent Results (from the past 240 hour(s))  Resp Panel by RT-PCR (Flu A&B, Covid) Nasopharyngeal Swab     Status: None   Collection Time: 12/02/2020  3:11 PM   Specimen: Nasopharyngeal Swab; Nasopharyngeal(NP) swabs in vial transport medium  Result Value Ref Range Status   SARS Coronavirus 2 by RT PCR NEGATIVE NEGATIVE Final    Comment: (NOTE) SARS-CoV-2 target nucleic acids are NOT DETECTED.  The SARS-CoV-2 RNA is generally detectable in upper respiratory specimens during the acute phase of infection.  The lowest concentration of SARS-CoV-2 viral copies this assay can detect is 138 copies/mL. A negative result does not preclude SARS-Cov-2 infection and should not be used as the sole basis for treatment or other patient management decisions. A negative result may occur with  improper specimen collection/handling, submission of specimen other than nasopharyngeal swab, presence of viral mutation(s) within the areas targeted by this assay, and inadequate number of viral copies(<138 copies/mL). A negative result must be combined with clinical observations, patient history, and epidemiological information. The expected result is Negative.  Fact Sheet for Patients:  BloggerCourse.com  Fact Sheet for Healthcare Providers:  SeriousBroker.it  This test is no t yet approved or cleared by the [place] FDA and  has been authorized for detection and/or diagnosis of SARS-CoV-2 by FDA under an Emergency Use Authorization (EUA). This EUA will remain  in effect (meaning this test can be used) for the duration of the COVID-19 declaration under Section 564(b)(1) of the Act, 21 U.S.C.section 360bbb-3(b)(1), unless the authorization is terminated  or revoked sooner.       Influenza A by PCR NEGATIVE NEGATIVE Final   Influenza B by PCR NEGATIVE NEGATIVE Final    Comment: (NOTE) The Xpert Xpress SARS-CoV-2/FLU/RSV plus assay is intended as an aid in the diagnosis of influenza from Nasopharyngeal swab specimens and should not be used as a sole basis for treatment. Nasal washings and aspirates are unacceptable for Xpert Xpress SARS-CoV-2/FLU/RSV testing.  Fact Sheet for Patients: BloggerCourse.com  Fact Sheet  for Healthcare Providers: SeriousBroker.it  This test is not yet approved or cleared by the [place] and has been authorized for detection and/or diagnosis of SARS-CoV-2 by FDA  under an Emergency Use Authorization (EUA). This EUA will remain in effect (meaning this test can be used) for the duration of the COVID-19 declaration under Section 564(b)(1) of the Act, 21 U.S.C. section 360bbb-3(b)(1), unless the authorization is terminated or revoked.  Performed at [facility], [address] [place], [place] [address]   MRSA Next Gen by PCR, Nasal     Status: None   Collection Time: 2020-11-24  6:39 PM   Specimen: Nasal Mucosa; Nasal Swab  Result Value Ref Range Status   MRSA by PCR Next Gen NOT DETECTED NOT DETECTED Final    Comment: (NOTE) The GeneXpert MRSA Assay (FDA approved for NASAL specimens only), is one component of a comprehensive MRSA colonization surveillance program. It is not intended to diagnose MRSA infection nor to guide or monitor treatment for MRSA infections. Test performance is not FDA approved in patients less than 96 years old. Performed at [facility], [address] [place], [place] [address]     Lab Basic Metabolic Panel: Recent Labs  Lab November 24, 2020 1441  NA 139  K 7.5*  CL 99  CO2 15*  GLUCOSE 441*  BUN 19  CREATININE 3.00*  CALCIUM 9.0  MG 3.6*   Liver Function Tests: Recent Labs  Lab 11-24-2020 1441  AST 3,554*  ALT 5,653*  ALKPHOS 113  BILITOT 0.8  PROT 5.3*  ALBUMIN 2.8*   No results for input(s): LIPASE, AMYLASE in the last 168 hours. No results for input(s): AMMONIA in the last 168 hours. CBC: Recent Labs  Lab 2020-11-24 1441  WBC 20.0*  NEUTROABS 10.0*  HGB 14.2  HCT 49.0  MCV 84.5  PLT 209   Cardiac Enzymes: No results for input(s): CKTOTAL, CKMB, CKMBINDEX, TROPONINI in the last 168 hours. Sepsis Labs: Recent Labs  Lab November 24, 2020 1441 November 24, 2020 1615 2020-11-24 1855  PROCALCITON 1.44  --   --   WBC 20.0*  --   --   LATICACIDVEN >11.0* >11.0* >11.0*    Procedures/Operations  24-Nov-2020 ETT placement   Qunisha Bryk L Rust-Chester 2020/11/24, 11:26 PM  Cheryll Cockayne  Rust-Chester, AGACNP-BC Acute Care Nurse Practitioner [facility]   [phone] / [phone] Please see Amion for pager details.

## 2020-11-12 NOTE — Progress Notes (Signed)
   2020-11-07 2200  Clinical Encounter Type  Visited With Patient  Visit Type Follow-up;Patient actively dying;Death;Critical Care;Spiritual support;Social support  Referral From Nurse  Consult/Referral To Chaplain   Nurse paged chaplain to tell him PT was placed on comfort care. Chaplain came and offered emotional and spiritual support. Chaplain made space for storytelling, which in turn help expressed their love for the PT. Chaplain ministered with compassionate presence, prayer, and hospitality. PT's family said they were grateful that I took such good care of them. Chaplain encourage them to lean on one another, and his loving memory.  Posey Boyer, M.Div.

## 2020-11-12 NOTE — ED Notes (Signed)
Pt bradycardic around 36, palpable pulse present

## 2020-11-12 NOTE — ED Notes (Signed)
Called ICU for report, was told primary RN was transporting a patient and would call back

## 2020-11-12 NOTE — ED Notes (Signed)
Pt intubated by Dr. Sidney Ace  8.0 tube 24 cm at the lip

## 2020-11-12 DEATH — deceased

## 2020-11-22 LAB — BLOOD GAS, VENOUS
Patient temperature: 37
pCO2, Ven: >120 mmHg (ref 44.0–60.0)
pH, Ven: <6.9 — CL (ref 7.250–7.430)
pO2, Ven: 48 mmHg — ABNORMAL HIGH (ref 32.0–45.0)
